# Patient Record
Sex: Female | Born: 1950 | Race: White | Hispanic: No | Marital: Married | Smoking: Never smoker
Health system: Southern US, Community
[De-identification: ages and names within clinical notes are randomized; demographics above are authoritative.]

## PROBLEM LIST (undated history)

## (undated) DIAGNOSIS — M1990 Unspecified osteoarthritis, unspecified site: Secondary | ICD-10-CM

## (undated) DIAGNOSIS — E215 Disorder of parathyroid gland, unspecified: Secondary | ICD-10-CM

## (undated) HISTORY — PX: TUBAL LIGATION: SHX77

## (undated) HISTORY — PX: WISDOM TOOTH EXTRACTION: SHX21

## (undated) HISTORY — PX: FRACTURE SURGERY: SHX138

## (undated) HISTORY — PX: OTHER SURGICAL HISTORY: SHX169

## (undated) HISTORY — PX: TONSILLECTOMY: SUR1361

## (undated) HISTORY — PX: EYE SURGERY: SHX253

---

## 2001-04-05 ENCOUNTER — Other Ambulatory Visit: Admission: RE | Admit: 2001-04-05 | Discharge: 2001-04-05 | Payer: Self-pay | Admitting: Family Medicine

## 2002-06-10 ENCOUNTER — Other Ambulatory Visit: Admission: RE | Admit: 2002-06-10 | Discharge: 2002-06-10 | Payer: Self-pay | Admitting: Family Medicine

## 2002-09-02 ENCOUNTER — Ambulatory Visit (HOSPITAL_COMMUNITY): Admission: RE | Admit: 2002-09-02 | Discharge: 2002-09-02 | Payer: Self-pay | Admitting: Gastroenterology

## 2007-12-28 ENCOUNTER — Encounter: Payer: Self-pay | Admitting: Cardiovascular Disease

## 2008-12-19 ENCOUNTER — Emergency Department (HOSPITAL_COMMUNITY): Admission: EM | Admit: 2008-12-19 | Discharge: 2008-12-20 | Payer: Self-pay | Admitting: Emergency Medicine

## 2009-05-25 ENCOUNTER — Encounter: Admission: RE | Admit: 2009-05-25 | Discharge: 2009-05-25 | Payer: Self-pay | Admitting: Specialist

## 2010-06-09 ENCOUNTER — Encounter: Payer: Self-pay | Admitting: Specialist

## 2010-07-05 IMAGING — CR DG CERVICAL SPINE COMPLETE 4+V
6 series · 6 of 6 positions shown · non-contrast
Comparison: None available.

CLINICAL DATA: Status post fall.

CERVICAL SPINE - COMPLETE 4+ VIEW

[w c-spine lat *]
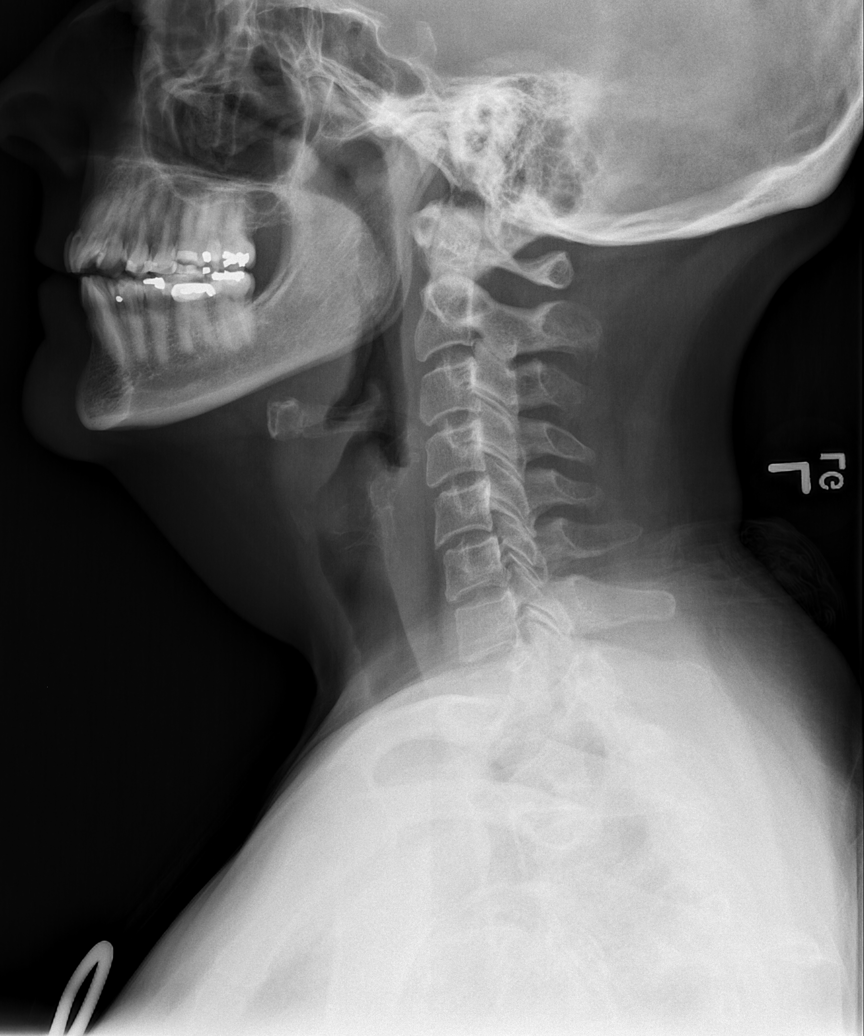

[w c-spine oblique *]
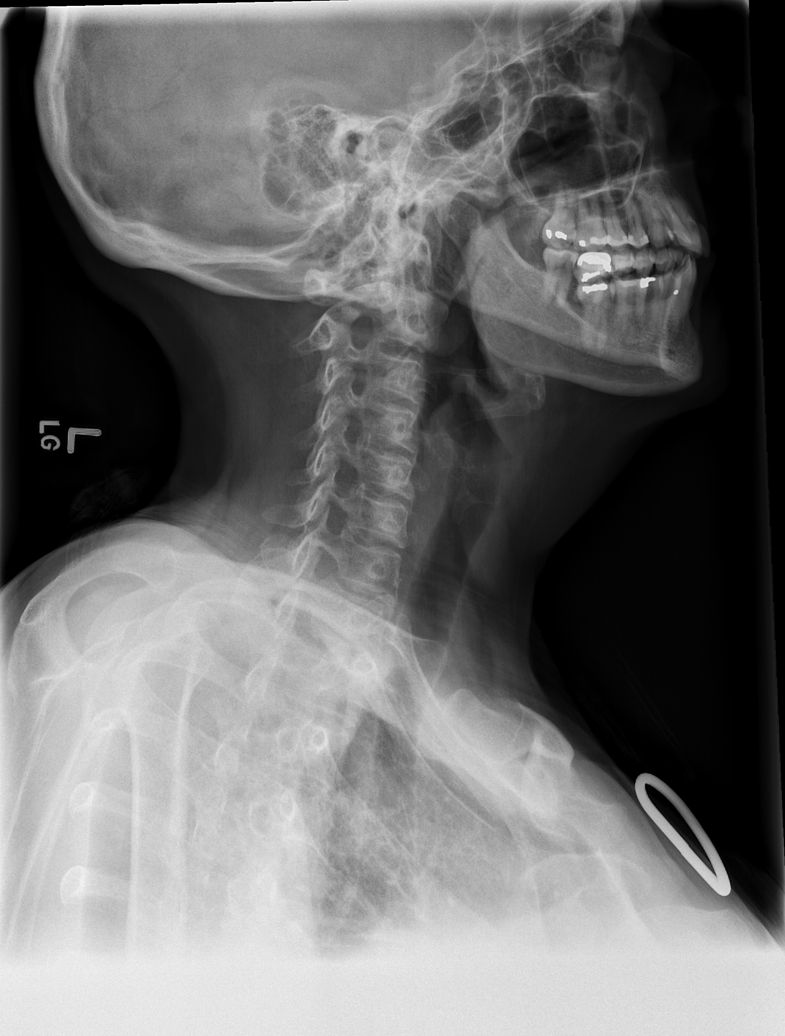

[w c-spine oblique]
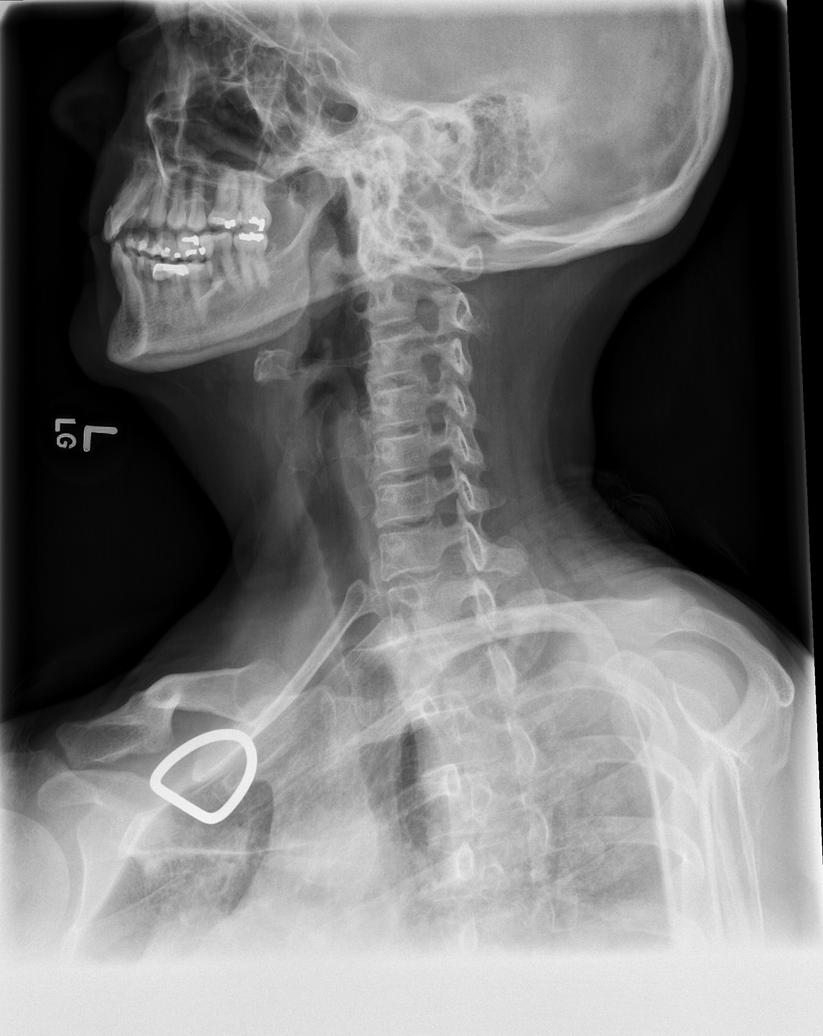

[w c-spine a.p. *]
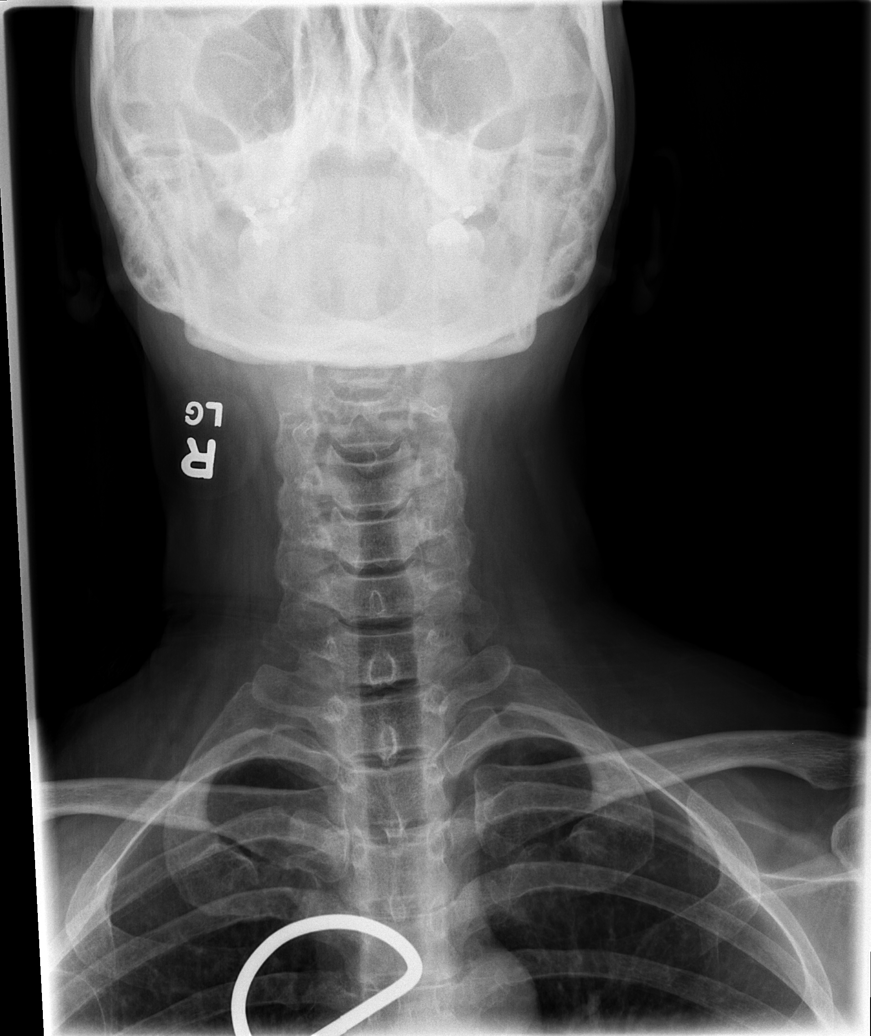

[w c-spine odontoid (1 of 2)]
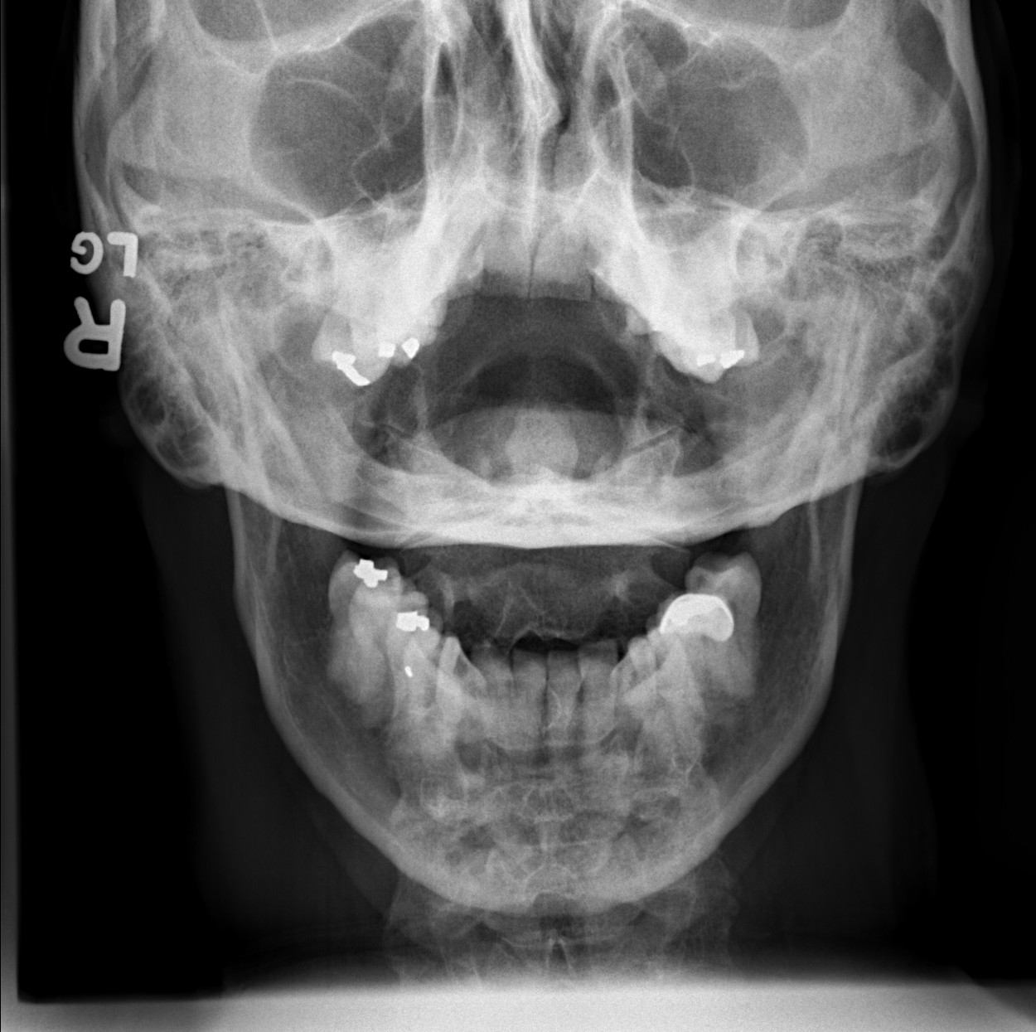

[w c-spine odontoid (2 of 2)]
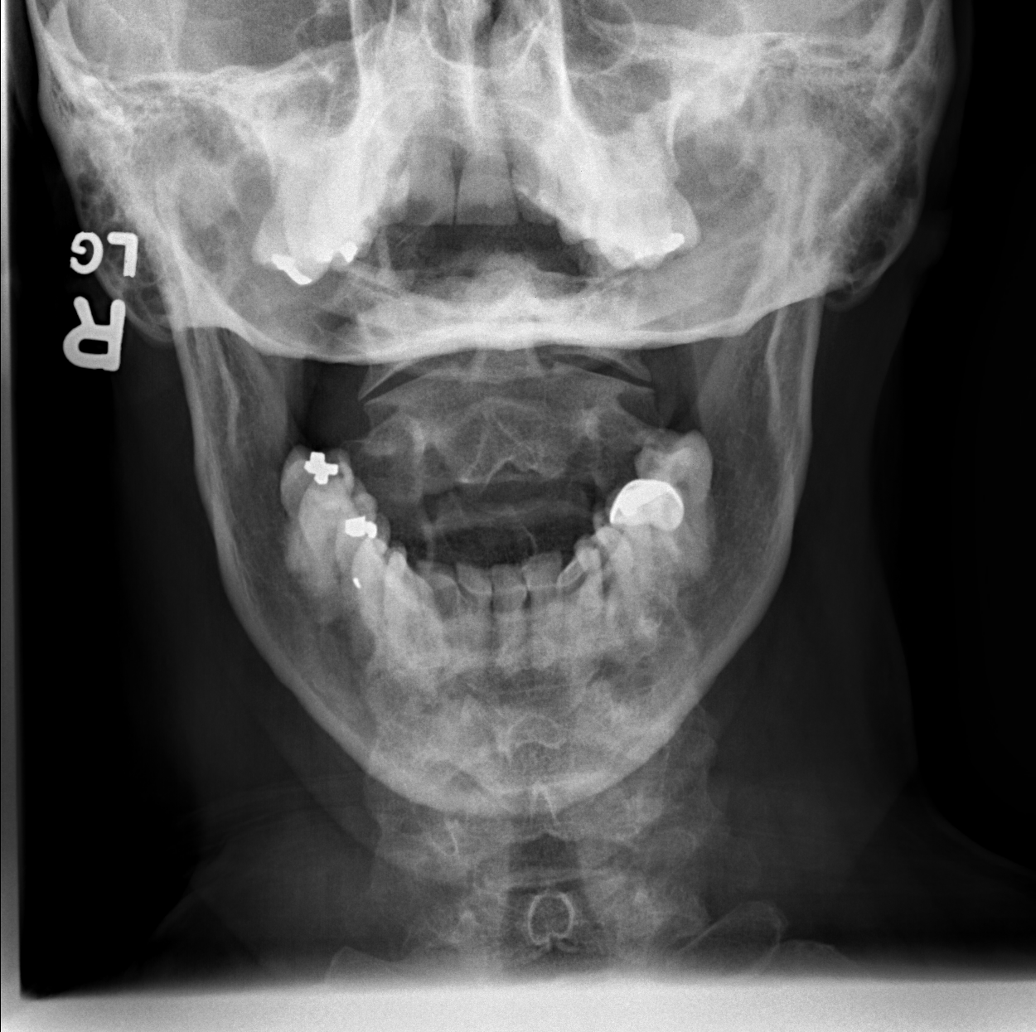

[6 of 6 positions shown; findings below may reference images not displayed]

FINDINGS: Vertebral body height and alignment are normal.
Prevertebral soft tissues appear normal.  Lung apices clear.
IMPRESSION: Negative exam.

## 2010-10-04 NOTE — Op Note (Signed)
NAME:  Melanie Watkins, Melanie Watkins                              ACCOUNT NO.:  [id number]   MEDICAL RECORD NO.:  [id number]                   PATIENT TYPE:  AMB   LOCATION:  ENDO                                 FACILITY:  [facility]   PHYSICIAN:  Anselmo Rod, M.D.               DATE OF BIRTH:  01/14/1951   DATE OF PROCEDURE:  09/02/2002  DATE OF DISCHARGE:                                 OPERATIVE REPORT   PROCEDURE:  Screening colonoscopy.   ENDOSCOPIST:  Anselmo Rod, M.D.   INSTRUMENT USED:  Olympus video colonoscope.   INDICATION FOR PROCEDURE:  Occasional rectal bleeding in a 60 year old white  female.  Rule out colonic polyps, masses, hemorrhoids, etc.   PREPROCEDURE PREPARATION:  Informed consent was procured from the patient.  The patient was fasted for eight hours prior to the procedure and prepped  with a bottle of magnesium citrate and a gallon of GoLYTELY the night prior  to the procedure.   PREPROCEDURE PHYSICAL:  VITAL SIGNS:  The patient had stable vital signs.  NECK:  Supple.  CHEST:  Clear to auscultation.  S1, S2 regular.  ABDOMEN:  Soft with normal bowel sounds.   DESCRIPTION OF PROCEDURE:  The patient was placed in the left lateral  decubitus position and sedated with 100 mg of Demerol and 10 mg of Versed  intravenously.  Once the patient was adequately sedate and maintained on low-  flow oxygen and continuous cardiac monitoring, the Olympus video colonoscope  was advanced from the rectum to the cecum with difficulty.  The patient's  position was changed from the left lateral to the supine and the right  lateral position, and abdominal pressure was applied to facilitate entry of  the scope into the cecum.  No masses, polyps, erosions, ulcerations, or  diverticula were seen.  The appendiceal orifice and the ileocecal valve were  clearly visualized and photographed.  The terminal ileum appeared normal and  without lesions.  Retroflexion in the rectum revealed small,  nonbleeding  internal hemorrhoids.   IMPRESSION:  1. Normal-appearing colon up to the terminal ileum except for small,     nonbleeding internal hemorrhoids.  2. Very tortuous colon, question irritable bowel syndrome (visceral     hypersensitivity noted with insufflation of air into the colon).   RECOMMENDATIONS:  1. A high-fiber diet with liberal fluid intake has been advised.  2.     Repeat colorectal cancer screening in the next 10 years unless the patient     develops any abnormal symptoms in the interim.  3. Outpatient follow-up on an p.r.n. basis.                                               Anselmo Rod, M.D.  JNM/MEDQ  D:  09/02/2002  T:  09/03/2002  Job:  [id number]   cc:   Talmadge Coventry, M.D.  [address]  [place]  [place] [address]  Fax: [phone]

## 2011-01-06 ENCOUNTER — Other Ambulatory Visit: Payer: Self-pay | Admitting: *Deleted

## 2011-01-06 MED ORDER — ROSUVASTATIN CALCIUM 10 MG PO TABS: 10.0000 mg | ORAL_TABLET | Freq: Every day | ORAL | Status: DC

## 2011-01-06 NOTE — Telephone Encounter (Signed)
30 DAYS GIVEN, NEEDS OV, WRITTEN ON SCRIPT

## 2012-01-12 ENCOUNTER — Other Ambulatory Visit: Payer: Self-pay | Admitting: Family Medicine

## 2012-01-12 ENCOUNTER — Other Ambulatory Visit (HOSPITAL_COMMUNITY)
Admission: RE | Admit: 2012-01-12 | Discharge: 2012-01-12 | Disposition: A | Discharge status: Home / Self Care | Payer: BC Managed Care – PPO | Source: Ambulatory Visit | Attending: Family Medicine | Admitting: Family Medicine

## 2012-01-12 DIAGNOSIS — Z0000 Encounter for general adult medical examination without abnormal findings: Secondary | ICD-10-CM | POA: Insufficient documentation

## 2016-02-27 ENCOUNTER — Other Ambulatory Visit (HOSPITAL_COMMUNITY): Payer: Self-pay | Admitting: Family Medicine

## 2016-02-27 DIAGNOSIS — R7989 Other specified abnormal findings of blood chemistry: Secondary | ICD-10-CM

## 2016-03-11 ENCOUNTER — Encounter (HOSPITAL_COMMUNITY): Admission: RE | Admit: 2016-03-11 | Payer: BLUE CROSS/BLUE SHIELD | Source: Ambulatory Visit

## 2016-03-11 ENCOUNTER — Encounter (HOSPITAL_COMMUNITY): Payer: BLUE CROSS/BLUE SHIELD

## 2016-05-26 ENCOUNTER — Encounter (HOSPITAL_COMMUNITY)
Admission: RE | Admit: 2016-05-26 | Discharge: 2016-05-26 | Disposition: A | Discharge status: Home / Self Care | Payer: BLUE CROSS/BLUE SHIELD | Source: Ambulatory Visit | Attending: Family Medicine | Admitting: Family Medicine

## 2016-05-26 DIAGNOSIS — R7989 Other specified abnormal findings of blood chemistry: Secondary | ICD-10-CM

## 2016-05-26 DIAGNOSIS — E215 Disorder of parathyroid gland, unspecified: Secondary | ICD-10-CM | POA: Diagnosis present

## 2016-05-26 MED ORDER — TECHNETIUM TC 99M SESTAMIBI - CARDIOLITE
26.5000 | Freq: Once | INTRAVENOUS | Status: AC | PRN
  Administered 2016-05-26: 10:00:00 26.5 via INTRAVENOUS

## 2016-06-20 ENCOUNTER — Ambulatory Visit: Payer: Self-pay | Admitting: Surgery

## 2016-06-23 ENCOUNTER — Other Ambulatory Visit (HOSPITAL_COMMUNITY)
Admission: RE | Admit: 2016-06-23 | Discharge: 2016-06-23 | Disposition: A | Discharge status: Home / Self Care | Payer: BLUE CROSS/BLUE SHIELD | Source: Ambulatory Visit | Attending: Family Medicine | Admitting: Family Medicine

## 2016-06-23 ENCOUNTER — Other Ambulatory Visit: Payer: Self-pay | Admitting: Family Medicine

## 2016-06-23 DIAGNOSIS — Z124 Encounter for screening for malignant neoplasm of cervix: Secondary | ICD-10-CM | POA: Diagnosis present

## 2016-06-26 LAB — CYTOLOGY - PAP: Diagnosis: NEGATIVE

## 2016-07-18 ENCOUNTER — Encounter (HOSPITAL_COMMUNITY)
Admission: RE | Admit: 2016-07-18 | Discharge: 2016-07-18 | Disposition: A | Discharge status: Home / Self Care | Payer: BLUE CROSS/BLUE SHIELD | Source: Ambulatory Visit | Attending: Surgery | Admitting: Surgery

## 2016-07-18 ENCOUNTER — Encounter (HOSPITAL_COMMUNITY): Payer: Self-pay

## 2016-07-18 DIAGNOSIS — Z01812 Encounter for preprocedural laboratory examination: Secondary | ICD-10-CM | POA: Insufficient documentation

## 2016-07-18 DIAGNOSIS — E215 Disorder of parathyroid gland, unspecified: Secondary | ICD-10-CM | POA: Insufficient documentation

## 2016-07-18 DIAGNOSIS — Z01810 Encounter for preprocedural cardiovascular examination: Secondary | ICD-10-CM | POA: Diagnosis present

## 2016-07-18 DIAGNOSIS — M1990 Unspecified osteoarthritis, unspecified site: Secondary | ICD-10-CM | POA: Diagnosis not present

## 2016-07-18 HISTORY — DX: Unspecified osteoarthritis, unspecified site: M19.90

## 2016-07-18 HISTORY — DX: Disorder of parathyroid gland, unspecified: E21.5

## 2016-07-18 LAB — BASIC METABOLIC PANEL
Anion gap: 8 (ref 5–15)
BUN: 14 mg/dL (ref 6–20)
CHLORIDE: 106 mmol/L (ref 101–111)
CO2: 27 mmol/L (ref 22–32)
Calcium: 10.5 mg/dL — ABNORMAL HIGH (ref 8.9–10.3)
Creatinine, Ser: 0.73 mg/dL (ref 0.44–1.00)
GFR calc Af Amer: >60 mL/min (ref >60)
GFR calc non Af Amer: >60 mL/min (ref >60)
Glucose, Bld: 104 mg/dL — ABNORMAL HIGH (ref 65–99)
POTASSIUM: 4.9 mmol/L (ref 3.5–5.1)
SODIUM: 141 mmol/L (ref 135–145)

## 2016-07-18 LAB — CBC
HEMATOCRIT: 42.4 % (ref 36.0–46.0)
HEMOGLOBIN: 13.6 g/dL (ref 12.0–15.0)
MCH: 30.4 pg (ref 26.0–34.0)
MCHC: 32.1 g/dL (ref 30.0–36.0)
MCV: 94.6 fL (ref 78.0–100.0)
Platelets: 267 10*3/uL (ref 150–400)
RBC: 4.48 MIL/uL (ref 3.87–5.11)
RDW: 13.5 % (ref 11.5–15.5)
WBC: 4.9 10*3/uL (ref 4.0–10.5)

## 2016-07-18 NOTE — Pre-Procedure Instructions (Signed)
    Melanie Watkins [facility]  07/18/2016      [id number] - [place], [place] [id number] [place] [id number] Phone: [phone] Fax: [phone]    Your procedure is scheduled on Thursday, March 8th   Report to [facility] Admitting at 5:30 AM   Call this number if you have problems the [facility] of surgery:  [phone], for any other questions, please call [phone] Monday thru Friday from 8 - 4:30 pm   Remember:  Do not eat food or drink liquids after midnight Wednesday.   Take these medicines the morning of surgery with A SIP OF WATER : Levothyroxine              4-5 days prior to surgery, STOP taking any Vitamins, Herbal Supplements, Anti-inflammatories   Do not wear jewelry, make-up or nail polish.  Do not wear lotions, powders, or perfumes, or deoderant.  Do not shave underarms & legs 48 hours prior to surgery.     Do not bring valuables to the hospital.  [facility] is not responsible for any belongings or valuables.  Contacts, dentures or bridgework may not be worn into surgery.  Leave your suitcase in the car.  After surgery it may be brought to your room.  For patients admitted to the hospital, discharge time will be determined by your treatment team.  Please read over the following fact sheets that you were given. Pain Booklet and Surgical Site Infection Prevention

## 2016-07-18 NOTE — Progress Notes (Addendum)
PCP is Dr. Deland Pretty   LOV 06/2016 Back in 2009, just for a baseline and family hx of CAD, she had echo & stress done -- negative results. Denies any cp, sob. Denies  murmur. She did go see Dr. Einar Gip in 2015 and had ekg & she thinks echo done, d/e "an explosion in her left eye"  It was thought to have some connection with her cholesterol med? But those tests were "normal"

## 2016-07-23 ENCOUNTER — Encounter (HOSPITAL_COMMUNITY): Payer: Self-pay | Admitting: Surgery

## 2016-07-23 DIAGNOSIS — E210 Primary hyperparathyroidism: Secondary | ICD-10-CM | POA: Diagnosis present

## 2016-07-23 NOTE — H&P (Signed)
General Surgery [facility]  Melanie Watkins DOB: 08/09/1950 Married / Language: English / Race: White Female  History of Present Illness   The patient is a 66 year old female who presents with a parathyroid neoplasm.  Patient is referred by Dr. Harlan Stains for evaluation of primary hyperparathyroidism. Patient was noted on routine laboratory studies to have a rising serum calcium level. Her most recent level is elevated at 11.1. Vitamin D level was normal at 45. Intact PTH level was checked and was at the upper end of normal at 64. Patient had a bone density scan in the past which showed osteopenia. 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 Patient is now referred for consideration for minimally invasive parathyroidectomy. Patient does note mild fatigue. She has osteopenia. She has mild bone and joint discomfort. She denies nephrolithiasis or frequent urination. There is no family history of parathyroid disease or other endocrine neoplasm. Patient is a Therapist, sports.   Past Surgical History  Cesarean Section - Multiple  Shoulder Surgery  Left.  Diagnostic Studies History Colonoscopy  1-5 years ago Mammogram  >3 years ago Pap Smear  1-5 years ago  Allergies No Known Drug Allergies 06/18/2016  Medication History  Ezetimibe-Simvastatin (10-40MG  Tablet, Oral) Active. Levothyroxine Sodium (88MCG Tablet, Oral) Active. Voltaren (50MG  Tablet DR, Oral) Active. B-12 (Oral) Specific strength unknown - Active. Vitamin D (2000UNIT Tablet, Oral two times daily) Active. Medications Reconciled  Social History  Alcohol use  Moderate alcohol use. Caffeine use  Coffee. No drug use  Tobacco use  Never smoker.  Family History  Alcohol Abuse  Brother, Family Members In General. Arthritis  Family Members In General. Cancer  Brother. Heart Disease   Brother, Father. Respiratory Condition  Brother.  Pregnancy / Birth History  Age at menarche  59 years. Age of menopause  51-55 Contraceptive History  Oral contraceptives. Gravida  2 Length (months) of breastfeeding  12-24 Maternal age  31-25 Para  2 Regular periods   Other Problems  Hypercholesterolemia  Thyroid Disease    Review of Systems General Not Present- Appetite Loss, Chills, Fatigue, Fever, Night Sweats, Weight Gain and Weight Loss. HEENT Not Present- Earache, Hearing Loss, Hoarseness, Nose Bleed, Oral Ulcers, Ringing in the Ears, Seasonal Allergies, Sinus Pain, Sore Throat, Visual Disturbances, Wears glasses/contact lenses and Yellow Eyes. Respiratory Not Present- Bloody sputum, Chronic Cough, Difficulty Breathing, Snoring and Wheezing. Breast Not Present- Breast Mass, Breast Pain, Nipple Discharge and Skin Changes. Cardiovascular Not Present- Chest Pain, Difficulty Breathing Lying Down, Leg Cramps, Palpitations, Rapid Heart Rate, Shortness of Breath and Swelling of Extremities. Gastrointestinal Not Present- Abdominal Pain, Bloating, Bloody Stool, Change in Bowel Habits, Chronic diarrhea, Constipation, Difficulty Swallowing, Excessive gas, Gets full quickly at meals, Hemorrhoids, Indigestion, Nausea, Rectal Pain and Vomiting. Female Genitourinary Not Present- Frequency, Nocturia, Painful Urination, Pelvic Pain and Urgency. Musculoskeletal Present- Back Pain and Joint Pain. Not Present- Joint Stiffness, Muscle Pain, Muscle Weakness and Swelling of Extremities. Neurological Not Present- Decreased Memory, Fainting, Headaches, Numbness, Seizures, Tingling, Tremor, Trouble walking and Weakness. Psychiatric Not Present- Anxiety, Bipolar, Change in Sleep Pattern, Depression, Fearful and Frequent crying. Endocrine Not Present- Cold Intolerance, Excessive Hunger, Hair Changes, Heat Intolerance, Hot flashes and New Diabetes. Hematology Not Present- Blood Thinners, Easy  Bruising, Excessive bleeding, Gland problems, HIV and Persistent Infections.  Vitals  Weight: 155.2 lb Height: 62in Body Surface Area: 1.72 m Body Mass Index: 28.39 kg/m  Temp.:  98.65F  Pulse: 65 (Regular)  BP: 128/60 (Sitting, Left Arm, Standard)   Physical Exam   The physical exam findings are as follows: Note:General - appears comfortable, no distress; not diaphorectic  HEENT - normocephalic; sclerae clear, gaze conjugate; mucous membranes moist, dentition good; voice normal  Neck - symmetric on extension; no palpable anterior or posterior cervical adenopathy; no palpable masses in the thyroid bed  Chest - clear bilaterally without rhonchi, rales, or wheeze  Cor - regular rhythm with normal rate; no significant murmur  Ext - non-tender without significant edema or lymphedema  Neuro - grossly intact; no tremor   Assessment & Plan   PRIMARY HYPERPARATHYROIDISM (E21.0)  Pt Education - Pamphlet Given - The Parathyroid Surgery Book: discussed with patient and provided information.  Patient presents with primary hyperparathyroidism and a nuclear medicine scan localizing a left inferior parathyroid adenoma. Patient is given written literature on parathyroid disease to review at home.  After reviewing her laboratory studies and her nuclear scan, I have recommended proceeding with minimally invasive parathyroidectomy. Patient and I discussed risk and benefits of the procedure. We discussed 4 gland exploration versus the minimally invasive procedure. We discussed the risk of recurrent laryngeal nerve injury. I would plan outpatient surgery. We discussed her postoperative recovery. She understands and wishes to proceed with surgery in the near future.  The risks and benefits of the procedure have been discussed at length with the patient. The patient understands the proposed procedure, potential alternative treatments, and the course of recovery to be expected.  All of the patient's questions have been answered at this time. The patient wishes to proceed with surgery.  Earnstine Regal, MD, [facility] Office: [phone]

## 2016-07-23 NOTE — Anesthesia Preprocedure Evaluation (Addendum)
Anesthesia Evaluation  Patient identified by MRN, date of birth, ID band Patient awake    Reviewed: Allergy & Precautions, NPO status , Patient's Chart, lab work & pertinent test results  Airway Mallampati: II  TM Distance: >3 FB Neck ROM: Full    Dental  (+) Dental Advisory Given, Teeth Intact   Pulmonary neg pulmonary ROS,    breath sounds clear to auscultation       Cardiovascular negative cardio ROS   Rhythm:Regular Rate:Normal     Neuro/Psych negative neurological ROS     GI/Hepatic negative GI ROS, Neg liver ROS,   Endo/Other  negative endocrine ROS  Renal/GU negative Renal ROS     Musculoskeletal  (+) Arthritis ,   Abdominal   Peds  Hematology negative hematology ROS (+)   Anesthesia Other Findings   Reproductive/Obstetrics                           Lab Results  Component Value Date   WBC 4.9 07/18/2016   HGB 13.6 07/18/2016   HCT 42.4 07/18/2016   MCV 94.6 07/18/2016   PLT 267 07/18/2016   Lab Results  Component Value Date   CREATININE 0.73 07/18/2016   BUN 14 07/18/2016   NA 141 07/18/2016   K 4.9 07/18/2016   CL 106 07/18/2016   CO2 27 07/18/2016    Anesthesia Physical Anesthesia Plan  ASA: II  Anesthesia Plan: General   Post-op Pain Management:    Induction: Intravenous  Airway Management Planned: Oral ETT  Additional Equipment:   Intra-op Plan:   Post-operative Plan: Extubation in OR  Informed Consent: I have reviewed the patients History and Physical, chart, labs and discussed the procedure including the risks, benefits and alternatives for the proposed anesthesia with the patient or authorized representative who has indicated his/her understanding and acceptance.   Dental advisory given  Plan Discussed with: CRNA  Anesthesia Plan Comments:        Anesthesia Quick Evaluation

## 2016-07-24 ENCOUNTER — Ambulatory Visit (HOSPITAL_COMMUNITY): Payer: BLUE CROSS/BLUE SHIELD | Admitting: Anesthesiology

## 2016-07-24 ENCOUNTER — Ambulatory Visit (HOSPITAL_COMMUNITY)
Admission: RE | Admit: 2016-07-24 | Discharge: 2016-07-24 | Disposition: A | Discharge status: Home / Self Care | Payer: BLUE CROSS/BLUE SHIELD | Source: Ambulatory Visit | Attending: Surgery | Admitting: Surgery

## 2016-07-24 ENCOUNTER — Encounter (HOSPITAL_COMMUNITY): Payer: Self-pay | Admitting: *Deleted

## 2016-07-24 ENCOUNTER — Encounter (HOSPITAL_COMMUNITY)
Admission: RE | Disposition: A | Discharge status: Home / Self Care | Payer: Self-pay | Source: Ambulatory Visit | Attending: Surgery

## 2016-07-24 DIAGNOSIS — M8580 Other specified disorders of bone density and structure, unspecified site: Secondary | ICD-10-CM | POA: Diagnosis not present

## 2016-07-24 DIAGNOSIS — M1990 Unspecified osteoarthritis, unspecified site: Secondary | ICD-10-CM | POA: Diagnosis not present

## 2016-07-24 DIAGNOSIS — Z811 Family history of alcohol abuse and dependence: Secondary | ICD-10-CM | POA: Insufficient documentation

## 2016-07-24 DIAGNOSIS — Z79899 Other long term (current) drug therapy: Secondary | ICD-10-CM | POA: Insufficient documentation

## 2016-07-24 DIAGNOSIS — D351 Benign neoplasm of parathyroid gland: Secondary | ICD-10-CM | POA: Insufficient documentation

## 2016-07-24 DIAGNOSIS — Z836 Family history of other diseases of the respiratory system: Secondary | ICD-10-CM | POA: Insufficient documentation

## 2016-07-24 DIAGNOSIS — Z8261 Family history of arthritis: Secondary | ICD-10-CM | POA: Insufficient documentation

## 2016-07-24 DIAGNOSIS — E210 Primary hyperparathyroidism: Secondary | ICD-10-CM | POA: Diagnosis present

## 2016-07-24 DIAGNOSIS — Z8249 Family history of ischemic heart disease and other diseases of the circulatory system: Secondary | ICD-10-CM | POA: Diagnosis not present

## 2016-07-24 DIAGNOSIS — E7800 Pure hypercholesterolemia, unspecified: Secondary | ICD-10-CM | POA: Diagnosis not present

## 2016-07-24 DIAGNOSIS — Z809 Family history of malignant neoplasm, unspecified: Secondary | ICD-10-CM | POA: Insufficient documentation

## 2016-07-24 HISTORY — PX: PARATHYROIDECTOMY: SHX19

## 2016-07-24 SURGERY — PARATHYROIDECTOMY
Anesthesia: General | Site: Neck | Laterality: Left

## 2016-07-24 MED ORDER — ONDANSETRON HCL 4 MG/2ML IJ SOLN
INTRAMUSCULAR | Status: AC
  Filled 2016-07-24: qty 2

## 2016-07-24 MED ORDER — CEFAZOLIN SODIUM-DEXTROSE 2-4 GM/100ML-% IV SOLN
2.0000 g | INTRAVENOUS | Status: AC
  Administered 2016-07-24: 2 g via INTRAVENOUS
  Filled 2016-07-24: qty 100

## 2016-07-24 MED ORDER — FENTANYL CITRATE (PF) 100 MCG/2ML IJ SOLN
INTRAMUSCULAR | Status: DC | PRN
  Administered 2016-07-24 (×2): 50 ug via INTRAVENOUS

## 2016-07-24 MED ORDER — LACTATED RINGERS IV SOLN
INTRAVENOUS | Status: DC | PRN
  Administered 2016-07-24: 07:00:00 via INTRAVENOUS

## 2016-07-24 MED ORDER — DEXAMETHASONE SODIUM PHOSPHATE 10 MG/ML IJ SOLN
INTRAMUSCULAR | Status: DC | PRN
  Administered 2016-07-24: 10 mg via INTRAVENOUS

## 2016-07-24 MED ORDER — ROCURONIUM BROMIDE 100 MG/10ML IV SOLN
INTRAVENOUS | Status: DC | PRN
  Administered 2016-07-24: 50 mg via INTRAVENOUS

## 2016-07-24 MED ORDER — CHLORHEXIDINE GLUCONATE CLOTH 2 % EX PADS: 6.0000 | MEDICATED_PAD | Freq: Once | CUTANEOUS | Status: DC

## 2016-07-24 MED ORDER — ROCURONIUM BROMIDE 50 MG/5ML IV SOSY
PREFILLED_SYRINGE | INTRAVENOUS | Status: AC
  Filled 2016-07-24: qty 5

## 2016-07-24 MED ORDER — HEMOSTATIC AGENTS (NO CHARGE) OPTIME
TOPICAL | Status: DC | PRN
  Administered 2016-07-24: 1

## 2016-07-24 MED ORDER — HYDROMORPHONE HCL 1 MG/ML IJ SOLN
0.2500 mg | INTRAMUSCULAR | Status: DC | PRN
  Administered 2016-07-24: 0.5 mg via INTRAVENOUS
  Administered 2016-07-24: 0.25 mg via INTRAVENOUS

## 2016-07-24 MED ORDER — BUPIVACAINE HCL (PF) 0.25 % IJ SOLN
INTRAMUSCULAR | Status: AC
  Filled 2016-07-24: qty 30

## 2016-07-24 MED ORDER — MIDAZOLAM HCL 2 MG/2ML IJ SOLN
INTRAMUSCULAR | Status: AC
  Filled 2016-07-24: qty 2

## 2016-07-24 MED ORDER — HYDROCODONE-ACETAMINOPHEN 5-325 MG PO TABS: 1.0000 | ORAL_TABLET | ORAL | 0 refills | Status: AC | PRN

## 2016-07-24 MED ORDER — PROMETHAZINE HCL 25 MG/ML IJ SOLN: 6.2500 mg | INTRAMUSCULAR | Status: DC | PRN

## 2016-07-24 MED ORDER — SUGAMMADEX SODIUM 200 MG/2ML IV SOLN
INTRAVENOUS | Status: AC
  Filled 2016-07-24: qty 2

## 2016-07-24 MED ORDER — LIDOCAINE HCL (CARDIAC) 20 MG/ML IV SOLN
INTRAVENOUS | Status: DC | PRN
Start: 2016-07-24 — End: 2016-07-24
  Administered 2016-07-24: 100 mg via INTRAVENOUS

## 2016-07-24 MED ORDER — PHENYLEPHRINE HCL 10 MG/ML IJ SOLN
INTRAVENOUS | Status: DC | PRN
  Administered 2016-07-24: 25 ug/min via INTRAVENOUS

## 2016-07-24 MED ORDER — LIDOCAINE 2% (20 MG/ML) 5 ML SYRINGE
INTRAMUSCULAR | Status: AC
  Filled 2016-07-24: qty 5

## 2016-07-24 MED ORDER — HYDROMORPHONE HCL 1 MG/ML IJ SOLN
INTRAMUSCULAR | Status: AC
  Administered 2016-07-24: 0.5 mg via INTRAVENOUS
  Filled 2016-07-24: qty 0.5

## 2016-07-24 MED ORDER — PROPOFOL 10 MG/ML IV BOLUS
INTRAVENOUS | Status: AC
  Filled 2016-07-24: qty 20

## 2016-07-24 MED ORDER — SUGAMMADEX SODIUM 200 MG/2ML IV SOLN
INTRAVENOUS | Status: DC | PRN
  Administered 2016-07-24: 200 mg via INTRAVENOUS

## 2016-07-24 MED ORDER — BUPIVACAINE HCL (PF) 0.25 % IJ SOLN
INTRAMUSCULAR | Status: DC | PRN
  Administered 2016-07-24: 10 mL

## 2016-07-24 MED ORDER — DEXAMETHASONE SODIUM PHOSPHATE 10 MG/ML IJ SOLN
INTRAMUSCULAR | Status: AC
  Filled 2016-07-24: qty 1

## 2016-07-24 MED ORDER — PROPOFOL 10 MG/ML IV BOLUS
INTRAVENOUS | Status: DC | PRN
  Administered 2016-07-24: 150 mg via INTRAVENOUS

## 2016-07-24 MED ORDER — MIDAZOLAM HCL 5 MG/5ML IJ SOLN
INTRAMUSCULAR | Status: DC | PRN
  Administered 2016-07-24: 2 mg via INTRAVENOUS

## 2016-07-24 MED ORDER — FENTANYL CITRATE (PF) 100 MCG/2ML IJ SOLN
INTRAMUSCULAR | Status: AC
  Filled 2016-07-24: qty 2

## 2016-07-24 MED ORDER — ONDANSETRON HCL 4 MG/2ML IJ SOLN
INTRAMUSCULAR | Status: DC | PRN
  Administered 2016-07-24: 4 mg via INTRAVENOUS

## 2016-07-24 MED ORDER — 0.9 % SODIUM CHLORIDE (POUR BTL) OPTIME
TOPICAL | Status: DC | PRN
  Administered 2016-07-24: 1000 mL

## 2016-07-24 SURGICAL SUPPLY — 50 items
ATTRACTOMAT 16X20 MAGNETIC DRP (DRAPES) ×3 IMPLANT
BLADE SURG 15 STRL LF DISP TIS (BLADE) ×1 IMPLANT
BLADE SURG 15 STRL SS (BLADE) ×2
CANISTER SUCT 3000ML PPV (MISCELLANEOUS) ×3 IMPLANT
CHLORAPREP W/TINT 26ML (MISCELLANEOUS) ×3 IMPLANT
CLIP TI MEDIUM 6 (CLIP) ×3 IMPLANT
CLIP TI WIDE RED SMALL 6 (CLIP) ×3 IMPLANT
CLOSURE WOUND 1/2 X4 (GAUZE/BANDAGES/DRESSINGS) ×1
CONT SPEC 4OZ CLIKSEAL STRL BL (MISCELLANEOUS) ×3 IMPLANT
COVER SURGICAL LIGHT HANDLE (MISCELLANEOUS) ×3 IMPLANT
CRADLE DONUT ADULT HEAD (MISCELLANEOUS) ×3 IMPLANT
DRAPE LAPAROTOMY 100X72 PEDS (DRAPES) ×3 IMPLANT
DRAPE UTILITY XL STRL (DRAPES) ×3 IMPLANT
ELECT CAUTERY BLADE 6.4 (BLADE) ×3 IMPLANT
ELECT REM PT RETURN 9FT ADLT (ELECTROSURGICAL) ×3
ELECTRODE REM PT RTRN 9FT ADLT (ELECTROSURGICAL) ×1 IMPLANT
GAUZE SPONGE 2X2 8PLY STRL LF (GAUZE/BANDAGES/DRESSINGS) ×1 IMPLANT
GAUZE SPONGE 4X4 16PLY XRAY LF (GAUZE/BANDAGES/DRESSINGS) ×3 IMPLANT
GLOVE BIO SURGEON STRL SZ7 (GLOVE) ×3 IMPLANT
GLOVE BIO SURGEON STRL SZ7.5 (GLOVE) ×3 IMPLANT
GLOVE BIOGEL PI IND STRL 7.0 (GLOVE) ×1 IMPLANT
GLOVE BIOGEL PI IND STRL 8 (GLOVE) ×1 IMPLANT
GLOVE BIOGEL PI INDICATOR 7.0 (GLOVE) ×2
GLOVE BIOGEL PI INDICATOR 8 (GLOVE) ×2
GLOVE SURG ORTHO 8.0 STRL STRW (GLOVE) ×3 IMPLANT
GLOVE SURG SIGNA 7.5 PF LTX (GLOVE) ×3 IMPLANT
GOWN STRL REUS W/ TWL LRG LVL3 (GOWN DISPOSABLE) ×1 IMPLANT
GOWN STRL REUS W/ TWL XL LVL3 (GOWN DISPOSABLE) ×2 IMPLANT
GOWN STRL REUS W/TWL LRG LVL3 (GOWN DISPOSABLE) ×2
GOWN STRL REUS W/TWL XL LVL3 (GOWN DISPOSABLE) ×4
HEMOSTAT SURGICEL 2X4 FIBR (HEMOSTASIS) ×3 IMPLANT
ILLUMINATOR WAVEGUIDE N/F (MISCELLANEOUS) ×3 IMPLANT
KIT BASIN OR (CUSTOM PROCEDURE TRAY) ×3 IMPLANT
KIT ROOM TURNOVER OR (KITS) ×3 IMPLANT
NEEDLE HYPO 25GX1X1/2 BEV (NEEDLE) ×3 IMPLANT
NS IRRIG 1000ML POUR BTL (IV SOLUTION) ×3 IMPLANT
PACK SURGICAL SETUP 50X90 (CUSTOM PROCEDURE TRAY) ×3 IMPLANT
PAD ARMBOARD 7.5X6 YLW CONV (MISCELLANEOUS) ×3 IMPLANT
PENCIL BUTTON HOLSTER BLD 10FT (ELECTRODE) ×3 IMPLANT
SPONGE GAUZE 2X2 STER 10/PKG (GAUZE/BANDAGES/DRESSINGS) ×2
STRIP CLOSURE SKIN 1/2X4 (GAUZE/BANDAGES/DRESSINGS) ×2 IMPLANT
SUT MNCRL AB 4-0 PS2 18 (SUTURE) ×3 IMPLANT
SUT VIC AB 3-0 SH 18 (SUTURE) ×3 IMPLANT
SYR BULB 3OZ (MISCELLANEOUS) ×3 IMPLANT
SYR CONTROL 10ML LL (SYRINGE) ×3 IMPLANT
TAPE CLOTH SURG 4X10 WHT LF (GAUZE/BANDAGES/DRESSINGS) ×3 IMPLANT
TOWEL OR 17X24 6PK STRL BLUE (TOWEL DISPOSABLE) ×3 IMPLANT
TOWEL OR 17X26 10 PK STRL BLUE (TOWEL DISPOSABLE) ×3 IMPLANT
TUBE CONNECTING 12'X1/4 (SUCTIONS) ×1
TUBE CONNECTING 12X1/4 (SUCTIONS) ×2 IMPLANT

## 2016-07-24 NOTE — Anesthesia Postprocedure Evaluation (Signed)
Anesthesia Post Note  Patient: Melanie Watkins  Procedure(s) Performed: Procedure(s) (LRB): LEFT INFERIOR PARATHYROIDECTOMY (Left)  Patient location during evaluation: PACU Anesthesia Type: General Level of consciousness: awake and alert Pain management: pain level controlled Vital Signs Assessment: post-procedure vital signs reviewed and stable Respiratory status: spontaneous breathing, nonlabored ventilation, respiratory function stable and patient connected to nasal cannula oxygen Cardiovascular status: blood pressure returned to baseline and stable Postop Assessment: no signs of nausea or vomiting Anesthetic complications: no       Last Vitals:  Vitals:   07/24/16 0845 07/24/16 0900  BP: 127/74 135/68  Pulse: 66 65  Resp: 16 16  Temp:      Last Pain:  Vitals:   07/24/16 0900  TempSrc:   PainSc: 5                  Tiajuana Amass

## 2016-07-24 NOTE — Op Note (Signed)
OPERATIVE REPORT - PARATHYROIDECTOMY  Preoperative diagnosis: Primary hyperparathyroidism  Postop diagnosis: Same  Procedure: Left inferior minimally invasive parathyroidectomy  Surgeon:  Earnstine Regal, MD, FACS  Assistant:  Alphonsa Overall, MD, FACS  Anesthesia: Gen. endotracheal  Estimated blood loss: Minimal  Preparation: ChloraPrep  Indications: The patient is a 66 year old female who presents with a parathyroid neoplasm.  Patient is referred by Dr. Harlan Stains for evaluation of primary hyperparathyroidism. Patient was noted on routine laboratory studies to have a rising serum calcium level. Her most recent level is elevated at 11.1. Vitamin D level was normal at 45. Intact PTH level was checked and was at the upper end of normal at 64. Patient had a bone density scan in the past which showed osteopenia. 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 Patient is now referred for consideration for minimally invasive parathyroidectomy.   Procedure: Patient was prepared in the holding area. He was brought to operating room and placed in a supine position on the operating room table. Following administration of general anesthesia, the patient was positioned and then prepped and draped in the usual strict aseptic fashion. After ascertaining that an adequate level of anesthesia been achieved, a neck incision was made with a #15 blade. Dissection was carried through subcutaneous tissues and platysma. Hemostasis was obtained with the electrocautery. Skin flaps were developed circumferentially and a Weitlander retractor was placed for exposure.  Strap muscles were incised in the midline. Strap muscles were reflected exposing the thyroid lobe. With gentle blunt dissection the thyroid lobe was mobilized.  Dissection was carried through adipose tissue and an enlarged parathyroid gland was identified. It was  gently mobilized. Vascular structures were divided between small and medium ligaclips. Care was taken to avoid the recurrent laryngeal nerve and the esophagus. The parathyroid gland was completely excised. It was submitted to pathology where frozen section confirmed parathyroid tissue consistent with adenoma.  Neck was irrigated with warm saline and good hemostasis was noted. Fibrillar was placed in the operative field. Strap muscles were reapproximated in the midline with interrupted 3-0 Vicryl sutures. Platysma was closed with interrupted 3-0 Vicryl sutures. Skin was closed with a running 4-0 Monocryl subcuticular suture. Marcaine was infiltrated circumferentially. Wound was washed and dried and Dermabond was applied. Patient was awakened from anesthesia and brought to the recovery room. The patient tolerated the procedure well.   Earnstine Regal, MD, [facility]

## 2016-07-24 NOTE — Transfer of Care (Signed)
Immediate Anesthesia Transfer of Care Note  Patient: Melanie Watkins  Procedure(s) Performed: Procedure(s): LEFT INFERIOR PARATHYROIDECTOMY (Left)  Patient Location: PACU  Anesthesia Type:General  Level of Consciousness: awake, alert  and oriented  Airway & Oxygen Therapy: Patient Spontanous Breathing and Patient connected to nasal cannula oxygen  Post-op Assessment: Report given to RN, Post -op Vital signs reviewed and stable and Patient moving all extremities X 4  Post vital signs: Reviewed and stable  Last Vitals:  Vitals:   07/24/16 0641  BP: 130/61  Pulse: 71  Resp: 18  Temp: 36.6 C    Last Pain:  Vitals:   07/24/16 0641  TempSrc: Oral         Complications: No apparent anesthesia complications

## 2016-07-24 NOTE — Interval H&P Note (Signed)
History and Physical Interval Note:  07/24/2016 7:06 AM  Melanie Watkins  has presented today for surgery, with the diagnosis of primary hyperparathyroidism  The various methods of treatment have been discussed with the patient and family. After consideration of risks, benefits and other options for treatment, the patient has consented to    Procedure(s): LEFT INFERIOR PARATHYROIDECTOMY (Left) as a surgical intervention .    The patient's history has been reviewed, patient examined, no change in status, stable for surgery.  I have reviewed the patient's chart and labs.  Questions were answered to the patient's satisfaction.    Earnstine Regal, MD, [facility] Office: [place]

## 2016-07-24 NOTE — Anesthesia Procedure Notes (Signed)
Procedure Name: Intubation Date/Time: 07/24/2016 7:35 AM Performed by: Kyung Rudd Pre-anesthesia Checklist: Patient identified, Emergency Drugs available, Suction available and Patient being monitored Patient Re-evaluated:Patient Re-evaluated prior to inductionOxygen Delivery Method: Circle system utilized Preoxygenation: Pre-oxygenation with 100% oxygen Intubation Type: IV induction Ventilation: Mask ventilation without difficulty Laryngoscope Size: Mac and 3 Grade View: Grade I Tube type: Oral Tube size: 7.0 mm Number of attempts: 1 Airway Equipment and Method: Stylet Placement Confirmation: ETT inserted through vocal cords under direct vision,  positive ETCO2 and breath sounds checked- equal and bilateral Secured at: 21 cm Tube secured with: Tape Dental Injury: Teeth and Oropharynx as per pre-operative assessment

## 2016-07-25 ENCOUNTER — Encounter (HOSPITAL_COMMUNITY): Payer: Self-pay | Admitting: Surgery

## 2017-07-20 DIAGNOSIS — E559 Vitamin D deficiency, unspecified: Secondary | ICD-10-CM | POA: Diagnosis not present

## 2017-07-20 DIAGNOSIS — E785 Hyperlipidemia, unspecified: Secondary | ICD-10-CM | POA: Diagnosis not present

## 2017-07-20 DIAGNOSIS — Z0000 Encounter for general adult medical examination without abnormal findings: Secondary | ICD-10-CM | POA: Diagnosis not present

## 2017-07-20 DIAGNOSIS — G4700 Insomnia, unspecified: Secondary | ICD-10-CM | POA: Diagnosis not present

## 2017-07-20 DIAGNOSIS — E039 Hypothyroidism, unspecified: Secondary | ICD-10-CM | POA: Diagnosis not present

## 2017-07-20 DIAGNOSIS — R7303 Prediabetes: Secondary | ICD-10-CM | POA: Diagnosis not present

## 2017-10-23 DIAGNOSIS — L821 Other seborrheic keratosis: Secondary | ICD-10-CM | POA: Diagnosis not present

## 2017-10-23 DIAGNOSIS — D1801 Hemangioma of skin and subcutaneous tissue: Secondary | ICD-10-CM | POA: Diagnosis not present

## 2017-10-23 DIAGNOSIS — Z85820 Personal history of malignant melanoma of skin: Secondary | ICD-10-CM | POA: Diagnosis not present

## 2017-10-23 DIAGNOSIS — D225 Melanocytic nevi of trunk: Secondary | ICD-10-CM | POA: Diagnosis not present

## 2017-10-23 DIAGNOSIS — L570 Actinic keratosis: Secondary | ICD-10-CM | POA: Diagnosis not present

## 2017-10-23 DIAGNOSIS — L814 Other melanin hyperpigmentation: Secondary | ICD-10-CM | POA: Diagnosis not present

## 2018-05-07 DIAGNOSIS — Z85820 Personal history of malignant melanoma of skin: Secondary | ICD-10-CM | POA: Diagnosis not present

## 2018-05-07 DIAGNOSIS — L720 Epidermal cyst: Secondary | ICD-10-CM | POA: Diagnosis not present

## 2018-05-07 DIAGNOSIS — L821 Other seborrheic keratosis: Secondary | ICD-10-CM | POA: Diagnosis not present

## 2018-05-07 DIAGNOSIS — D225 Melanocytic nevi of trunk: Secondary | ICD-10-CM | POA: Diagnosis not present

## 2018-05-07 DIAGNOSIS — L814 Other melanin hyperpigmentation: Secondary | ICD-10-CM | POA: Diagnosis not present

## 2018-09-23 DIAGNOSIS — E559 Vitamin D deficiency, unspecified: Secondary | ICD-10-CM | POA: Diagnosis not present

## 2018-09-23 DIAGNOSIS — R7301 Impaired fasting glucose: Secondary | ICD-10-CM | POA: Diagnosis not present

## 2018-09-23 DIAGNOSIS — E039 Hypothyroidism, unspecified: Secondary | ICD-10-CM | POA: Diagnosis not present

## 2018-09-23 DIAGNOSIS — E785 Hyperlipidemia, unspecified: Secondary | ICD-10-CM | POA: Diagnosis not present

## 2018-10-28 DIAGNOSIS — M20011 Mallet finger of right finger(s): Secondary | ICD-10-CM | POA: Diagnosis not present

## 2018-11-03 DIAGNOSIS — L821 Other seborrheic keratosis: Secondary | ICD-10-CM | POA: Diagnosis not present

## 2018-11-03 DIAGNOSIS — D2271 Melanocytic nevi of right lower limb, including hip: Secondary | ICD-10-CM | POA: Diagnosis not present

## 2018-11-03 DIAGNOSIS — Z85820 Personal history of malignant melanoma of skin: Secondary | ICD-10-CM | POA: Diagnosis not present

## 2018-11-03 DIAGNOSIS — D225 Melanocytic nevi of trunk: Secondary | ICD-10-CM | POA: Diagnosis not present

## 2018-12-15 DIAGNOSIS — M20011 Mallet finger of right finger(s): Secondary | ICD-10-CM | POA: Diagnosis not present

## 2019-01-12 DIAGNOSIS — M20011 Mallet finger of right finger(s): Secondary | ICD-10-CM | POA: Diagnosis not present

## 2019-01-13 DIAGNOSIS — M79644 Pain in right finger(s): Secondary | ICD-10-CM | POA: Diagnosis not present

## 2019-01-13 DIAGNOSIS — M20011 Mallet finger of right finger(s): Secondary | ICD-10-CM | POA: Diagnosis not present

## 2019-02-09 DIAGNOSIS — G5603 Carpal tunnel syndrome, bilateral upper limbs: Secondary | ICD-10-CM | POA: Diagnosis not present

## 2019-02-09 DIAGNOSIS — M20011 Mallet finger of right finger(s): Secondary | ICD-10-CM | POA: Diagnosis not present

## 2019-03-27 DIAGNOSIS — Z20828 Contact with and (suspected) exposure to other viral communicable diseases: Secondary | ICD-10-CM | POA: Diagnosis not present

## 2019-04-11 DIAGNOSIS — Z23 Encounter for immunization: Secondary | ICD-10-CM | POA: Diagnosis not present

## 2019-05-04 DIAGNOSIS — Z85820 Personal history of malignant melanoma of skin: Secondary | ICD-10-CM | POA: Diagnosis not present

## 2019-05-04 DIAGNOSIS — C4372 Malignant melanoma of left lower limb, including hip: Secondary | ICD-10-CM | POA: Diagnosis not present

## 2019-05-04 DIAGNOSIS — L821 Other seborrheic keratosis: Secondary | ICD-10-CM | POA: Diagnosis not present

## 2019-05-04 DIAGNOSIS — D225 Melanocytic nevi of trunk: Secondary | ICD-10-CM | POA: Diagnosis not present

## 2019-05-04 DIAGNOSIS — L814 Other melanin hyperpigmentation: Secondary | ICD-10-CM | POA: Diagnosis not present
